# Patient Record
Sex: Male | Born: 1950 | Race: Black or African American | Hispanic: No | Smoking: Never smoker
Health system: Southern US, Community
[De-identification: ages and names within clinical notes are randomized; demographics above are authoritative.]

## PROBLEM LIST (undated history)

## (undated) DIAGNOSIS — I639 Cerebral infarction, unspecified: Secondary | ICD-10-CM

---

## 2018-04-22 ENCOUNTER — Emergency Department (HOSPITAL_COMMUNITY): Payer: Medicare Other

## 2018-04-22 ENCOUNTER — Emergency Department (HOSPITAL_COMMUNITY)
Admission: EM | Admit: 2018-04-22 | Discharge: 2018-04-22 | Disposition: A | Discharge status: Home / Self Care | Payer: Medicare Other | Attending: Emergency Medicine | Admitting: Emergency Medicine

## 2018-04-22 ENCOUNTER — Encounter (HOSPITAL_COMMUNITY): Payer: Self-pay

## 2018-04-22 ENCOUNTER — Other Ambulatory Visit: Payer: Self-pay

## 2018-04-22 DIAGNOSIS — R531 Weakness: Secondary | ICD-10-CM | POA: Insufficient documentation

## 2018-04-22 DIAGNOSIS — Z8673 Personal history of transient ischemic attack (TIA), and cerebral infarction without residual deficits: Secondary | ICD-10-CM | POA: Diagnosis not present

## 2018-04-22 DIAGNOSIS — R200 Anesthesia of skin: Secondary | ICD-10-CM | POA: Diagnosis present

## 2018-04-22 DIAGNOSIS — M5412 Radiculopathy, cervical region: Secondary | ICD-10-CM

## 2018-04-22 HISTORY — DX: Cerebral infarction, unspecified: I63.9

## 2018-04-22 LAB — CBG MONITORING, ED: Glucose-Capillary: 82 mg/dL (ref 70–99)

## 2018-04-22 LAB — BASIC METABOLIC PANEL
Anion gap: 10 (ref 5–15)
BUN: 8 mg/dL (ref 8–23)
CHLORIDE: 106 mmol/L (ref 98–111)
CO2: 22 mmol/L (ref 22–32)
Calcium: 8.9 mg/dL (ref 8.9–10.3)
Creatinine, Ser: 1.09 mg/dL (ref 0.61–1.24)
GFR calc non Af Amer: >60 mL/min (ref >60)
Glucose, Bld: 102 mg/dL — ABNORMAL HIGH (ref 70–99)
Potassium: 3.7 mmol/L (ref 3.5–5.1)
Sodium: 138 mmol/L (ref 135–145)

## 2018-04-22 LAB — APTT: aPTT: 22 seconds — ABNORMAL LOW (ref 24–36)

## 2018-04-22 LAB — CBC
HCT: 38.1 % — ABNORMAL LOW (ref 39.0–52.0)
Hemoglobin: 12.2 g/dL — ABNORMAL LOW (ref 13.0–17.0)
MCH: 27.5 pg (ref 26.0–34.0)
MCHC: 32 g/dL (ref 30.0–36.0)
MCV: 85.8 fL (ref 80.0–100.0)
NRBC: 0.4 % — AB (ref 0.0–0.2)
Platelets: 250 10*3/uL (ref 150–400)
RBC: 4.44 MIL/uL (ref 4.22–5.81)
RDW: 14.3 % (ref 11.5–15.5)
WBC: 4.7 10*3/uL (ref 4.0–10.5)

## 2018-04-22 LAB — I-STAT CREATININE, ED: CREATININE: 1.1 mg/dL (ref 0.61–1.24)

## 2018-04-22 LAB — ETHANOL

## 2018-04-22 LAB — PROTIME-INR
INR: 1 (ref 0.8–1.2)
Prothrombin Time: 12.7 seconds (ref 11.4–15.2)

## 2018-04-22 MED ORDER — IOPAMIDOL (ISOVUE-370) INJECTION 76%
75.0000 mL | Freq: Once | INTRAVENOUS | Status: AC | PRN
  Administered 2018-04-22: 75 mL via INTRAVENOUS

## 2018-04-22 MED ORDER — SODIUM CHLORIDE 0.9% FLUSH: 3.0000 mL | Freq: Once | INTRAVENOUS | Status: DC

## 2018-04-22 NOTE — ED Provider Notes (Signed)
[facility] EMERGENCY DEPARTMENT Provider Note   CSN: [id number] Arrival date & time: 04/22/18  1630  An emergency department physician performed an initial assessment on this suspected stroke patient at 1845.  History   Chief Complaint Chief Complaint  Patient presents with  . Numbness    HPI Derek Palmer is a 68 y.o. male.     HPI Patient states that at roughly 4:00 he had numbness and tingling to his right hand and numbness and tingling to his knee.  Also had some weakness to the hand.  Denies any visual or speech changes.  Denies neck pain.  No trauma.  No chest pain or shortness of breath.   Past Medical History:  Diagnosis Date  . Stroke [facility])     There are no active problems to display for this patient.   History reviewed. No pertinent surgical history.      Home Medications    Prior to Admission medications   Not on File    Family History History reviewed. No pertinent family history.  Social History Social History   Tobacco Use  . Smoking status: Never Smoker  . Smokeless tobacco: Never Used  Substance Use Topics  . Alcohol use: Not on file  . Drug use: Not on file     Allergies   Penicillin g   Review of Systems Review of Systems  Constitutional: Negative for chills and fever.  HENT: Negative for sore throat and trouble swallowing.   Eyes: Negative for visual disturbance.  Respiratory: Negative for cough, chest tightness and shortness of breath.   Cardiovascular: Negative for chest pain and palpitations.  Gastrointestinal: Negative for abdominal pain, diarrhea, nausea and vomiting.  Musculoskeletal: Negative for back pain, myalgias and neck pain.  Skin: Negative for rash and wound.  Neurological: Positive for weakness and numbness. Negative for dizziness, syncope, light-headedness and headaches.  All other systems reviewed and are negative.    Physical Exam Updated Vital Signs BP (!) 144/111 (BP Location:  Right Arm)   Pulse 64   Temp 98.1 F (36.7 C) (Oral)   Resp (!) 21   SpO2 100%   Physical Exam Vitals signs and nursing note reviewed.  Constitutional:      General: He is not in acute distress.    Appearance: Normal appearance. He is well-developed. He is not ill-appearing.  HENT:     Head: Normocephalic and atraumatic.     Comments: Cranial nerves II through XII grossly intact.    Nose: Nose normal.     Mouth/Throat:     Mouth: Mucous membranes are moist.  Eyes:     Extraocular Movements: Extraocular movements intact.     Pupils: Pupils are equal, round, and reactive to light.  Neck:     Musculoskeletal: Normal range of motion and neck supple. No neck rigidity or muscular tenderness.  Cardiovascular:     Rate and Rhythm: Normal rate and regular rhythm.     Heart sounds: No murmur. No friction rub. No gallop.   Pulmonary:     Effort: Pulmonary effort is normal. No respiratory distress.     Breath sounds: Normal breath sounds. No stridor. No wheezing, rhonchi or rales.  Chest:     Chest wall: No tenderness.  Abdominal:     General: Bowel sounds are normal.     Palpations: Abdomen is soft.     Tenderness: There is no abdominal tenderness. There is no guarding or rebound.  Musculoskeletal: Normal range of motion.  General: No swelling, tenderness, deformity or signs of injury.     Right lower leg: No edema.     Left lower leg: No edema.     Comments: Mildly diminished cap refill on right compared to left hand.  Negative Tinel and Phalen sign.  No hand, forearm swelling.  2+ radial pulses bilaterally.  Lymphadenopathy:     Cervical: No cervical adenopathy.  Skin:    General: Skin is warm and dry.     Findings: No erythema or rash.  Neurological:     Mental Status: He is alert and oriented to person, place, and time.     Comments: Diminished sensation to light touch over the distal tips of the fingers of the right hand and to the superior/lateral surface of the right  knee.  Patient appears to have some weakness of the intrinsic muscles of the right hand.  Question mild right-sided grip weakness compared to left.  5/5 motor bilateral lower extremities.  Psychiatric:        Behavior: Behavior normal.      ED Treatments / Results  Labs (all labs ordered are listed, but only abnormal results are displayed) Labs Reviewed  BASIC METABOLIC PANEL - Abnormal; Notable for the following components:      Result Value   Glucose, Bld 102 (*)    All other components within normal limits  CBC - Abnormal; Notable for the following components:   Hemoglobin 12.2 (*)    HCT 38.1 (*)    nRBC 0.4 (*)    All other components within normal limits  APTT - Abnormal; Notable for the following components:   aPTT 22 (*)    All other components within normal limits  ETHANOL  PROTIME-INR  URINALYSIS, ROUTINE W REFLEX MICROSCOPIC  RAPID URINE DRUG SCREEN, HOSP PERFORMED  CBG MONITORING, ED  I-STAT CREATININE, ED    EKG EKG Interpretation  Date/Time:  Monday April 22 2018 16:39:10 EST Ventricular Rate:  72 PR Interval:  186 QRS Duration: 86 QT Interval:  372 QTC Calculation: 407 R Axis:   24 Text Interpretation:  Normal sinus rhythm Normal ECG Confirmed by Loren Racer ([id number]) on 04/22/2018 6:40:58 PM   Radiology Ct Angio Head W Or Wo Contrast  Result Date: 04/22/2018 CLINICAL DATA:  Follow up code stroke.  RIGHT-sided weakness. EXAM: CT ANGIOGRAPHY HEAD AND NECK TECHNIQUE: Multidetector CT imaging of the head and neck was performed using the standard protocol during bolus administration of intravenous contrast. Multiplanar CT image reconstructions and MIPs were obtained to evaluate the vascular anatomy. Carotid stenosis measurements (when applicable) are obtained utilizing NASCET criteria, using the distal internal carotid diameter as the denominator. CONTRAST:  66mL ISOVUE-370 IOPAMIDOL (ISOVUE-370) INJECTION 76% COMPARISON:  CT HEAD April 22, 2018 at 1900 hours.  FINDINGS: CTA NECK FINDINGS: AORTIC ARCH: Normal appearance of the thoracic arch, normal branch pattern. The origins of the innominate, left Common carotid artery and subclavian artery are patent. RIGHT CAROTID SYSTEM: Common carotid artery is patent. Normal appearance of the carotid bifurcation without hemodynamically significant stenosis by NASCET criteria. Normal appearance of the internal carotid artery. LEFT CAROTID SYSTEM: Common carotid artery is patent. Normal appearance of the carotid bifurcation without hemodynamically significant stenosis by NASCET criteria. Normal appearance of the internal carotid artery. VERTEBRAL ARTERIES:Left vertebral artery is dominant. Patent vertebral arteries, mild extrinsic compression due to degenerative cervical spine. SKELETON: No acute osseous process though bone windows have not been submitted. Severe cervical spondylosis. Severe C6-7 neural foraminal narrowing. OTHER NECK:  Soft tissues of the neck are nonacute though, not tailored for evaluation. Mild debris distended esophagus. UPPER CHEST: Included lung apices are clear. Mild centrilobular emphysema. No superior mediastinal lymphadenopathy. CTA HEAD FINDINGS: ANTERIOR CIRCULATION: Patent cervical internal carotid arteries, petrous, cavernous and supra clinoid internal carotid arteries. Patent anterior communicating artery. Patent anterior and middle cerebral arteries. No large vessel occlusion, flow-limiting stenosis, contrast extravasation or aneurysm. POSTERIOR CIRCULATION: Patent vertebral arteries, vertebrobasilar junction and basilar artery, as well as main branch vessels. Patent posterior cerebral arteries. No large vessel occlusion, flow-limiting stenosis, contrast extravasation or aneurysm. VENOUS SINUSES: Major dural venous sinuses are patent though not tailored for evaluation on this angiographic examination. ANATOMIC VARIANTS: None. DELAYED PHASE: Not performed. MIP images reviewed. IMPRESSION: CTA NECK: 1.  Negative CTA NECK. 2. Severe C6-7 neural foraminal narrowing. CTA HEAD: 1. Negative CTA HEAD. Electronically Signed   By: Awilda Metro M.D.   On: 04/22/2018 19:41   Ct Angio Neck W Or Wo Contrast  Result Date: 04/22/2018 CLINICAL DATA:  Follow up code stroke.  RIGHT-sided weakness. EXAM: CT ANGIOGRAPHY HEAD AND NECK TECHNIQUE: Multidetector CT imaging of the head and neck was performed using the standard protocol during bolus administration of intravenous contrast. Multiplanar CT image reconstructions and MIPs were obtained to evaluate the vascular anatomy. Carotid stenosis measurements (when applicable) are obtained utilizing NASCET criteria, using the distal internal carotid diameter as the denominator. CONTRAST:  75mL ISOVUE-370 IOPAMIDOL (ISOVUE-370) INJECTION 76% COMPARISON:  CT HEAD April 22, 2018 at 1900 hours. FINDINGS: CTA NECK FINDINGS: AORTIC ARCH: Normal appearance of the thoracic arch, normal branch pattern. The origins of the innominate, left Common carotid artery and subclavian artery are patent. RIGHT CAROTID SYSTEM: Common carotid artery is patent. Normal appearance of the carotid bifurcation without hemodynamically significant stenosis by NASCET criteria. Normal appearance of the internal carotid artery. LEFT CAROTID SYSTEM: Common carotid artery is patent. Normal appearance of the carotid bifurcation without hemodynamically significant stenosis by NASCET criteria. Normal appearance of the internal carotid artery. VERTEBRAL ARTERIES:Left vertebral artery is dominant. Patent vertebral arteries, mild extrinsic compression due to degenerative cervical spine. SKELETON: No acute osseous process though bone windows have not been submitted. Severe cervical spondylosis. Severe C6-7 neural foraminal narrowing. OTHER NECK: Soft tissues of the neck are nonacute though, not tailored for evaluation. Mild debris distended esophagus. UPPER CHEST: Included lung apices are clear. Mild centrilobular  emphysema. No superior mediastinal lymphadenopathy. CTA HEAD FINDINGS: ANTERIOR CIRCULATION: Patent cervical internal carotid arteries, petrous, cavernous and supra clinoid internal carotid arteries. Patent anterior communicating artery. Patent anterior and middle cerebral arteries. No large vessel occlusion, flow-limiting stenosis, contrast extravasation or aneurysm. POSTERIOR CIRCULATION: Patent vertebral arteries, vertebrobasilar junction and basilar artery, as well as main branch vessels. Patent posterior cerebral arteries. No large vessel occlusion, flow-limiting stenosis, contrast extravasation or aneurysm. VENOUS SINUSES: Major dural venous sinuses are patent though not tailored for evaluation on this angiographic examination. ANATOMIC VARIANTS: None. DELAYED PHASE: Not performed. MIP images reviewed. IMPRESSION: CTA NECK: 1. Negative CTA NECK. 2. Severe C6-7 neural foraminal narrowing. CTA HEAD: 1. Negative CTA HEAD. Electronically Signed   By: Awilda Metro M.D.   On: 04/22/2018 19:41   Mr Brain Wo Contrast  Result Date: 04/22/2018 CLINICAL DATA:  RIGHT hand numbness for 1 hour. EXAM: MRI HEAD WITHOUT CONTRAST TECHNIQUE: Multiplanar, multiecho pulse sequences of the brain and surrounding structures were obtained without intravenous contrast. COMPARISON:  CT HEAD April 22, 2018 at 1900 hours. FINDINGS: Moderately motion degraded coronal T2 sequence. INTRACRANIAL  CONTENTS: No reduced diffusion to suggest acute ischemia. No susceptibility artifact to suggest hemorrhage. No parenchymal brain volume loss for age. No hydrocephalus. No suspicious parenchymal signal, masses, mass effect. No abnormal extra-axial fluid collections. Small calcification along the interhemispheric fissure. VASCULAR: Normal major intracranial vascular flow voids present at skull base. SKULL AND UPPER CERVICAL SPINE: No abnormal sellar expansion. No suspicious calvarial bone marrow signal. Moderate cervical spondylosis. Dental  malocclusion. Craniocervical junction maintained. SINUSES/ORBITS: Trace paranasal sinus mucosal thickening. Mastoid air cells are well aerated.The included ocular globes and orbital contents are non-suspicious. OTHER: None. IMPRESSION: 1. Negative motion degraded MRI of the head without contrast. Electronically Signed   By: Awilda Metro M.D.   On: 04/22/2018 20:59   Mr Cervical Spine Wo Contrast  Result Date: 04/22/2018 CLINICAL DATA:  RIGHT hand numbness beginning today. EXAM: MRI CERVICAL SPINE WITHOUT CONTRAST TECHNIQUE: Multiplanar, multisequence MR imaging of the cervical spine was performed. No intravenous contrast was administered. COMPARISON:  CT angiogram neck April 22, 2018. FINDINGS: ALIGNMENT: Maintained cervical lordosis.  No malalignment. VERTEBRAE/DISCS: Vertebral bodies are intact. Severe C3-4 through C7-T1 disc height loss with proportional chronic discogenic endplate changes, acute component at C3-4. Low T1 and bright STIR signal LEFT C3-4 facet. Borderline congenital canal narrowing. CORD:Cervical spinal cord is normal morphology and signal characteristics from the cervicomedullary junction to level of T2-3, the most caudal well visualized level. POSTERIOR FOSSA, VERTEBRAL ARTERIES, PARASPINAL TISSUES: No MR findings of ligamentous injury. Vertebral artery flow voids present. Included posterior fossa and paraspinal soft tissues are normal. DISC LEVELS: Moderately motion degraded axial sequences limited evaluation. C2-3: Small broad-based disc bulge, uncovertebral hypertrophy. Severe RIGHT and mild LEFT facet arthropathy. Mild canal stenosis. Moderate RIGHT foraminal narrowing. C3-4: Small broad-based disc bulge, uncovertebral hypertrophy and moderate facet arthropathy. Moderate canal stenosis. Moderate to severe bilateral neural foraminal narrowing. C4-5: Small broad-based disc bulge, uncovertebral hypertrophy. Moderate RIGHT facet arthropathy. No canal stenosis. Severe RIGHT and moderate  to severe LEFT neural foraminal narrowing. C5-6: Annular bulging asymmetric to the RIGHT, uncovertebral hypertrophy. No canal stenosis. Severe RIGHT and moderate LEFT neural foraminal narrowing. C6-7: Small broad-based disc bulge, superimposed RIGHT subarticular moderate disc protrusion. Uncovertebral hypertrophy. No canal stenosis. Severe RIGHT > LEFT neural foraminal narrowing. C7-T1: Small broad-based disc bulge, endplate spurring. No canal stenosis. Moderate LEFT neural foraminal narrowing. IMPRESSION: 1. Motion degraded examination.  No fracture or malalignment. 2. 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 3. Moderate canal stenosis C3-4, mild canal stenosis C2-3. 4. Neural foraminal narrowing all cervical levels: Severe at C4-5 through C6-7. Electronically Signed   By: Awilda Metro M.D.   On: 04/22/2018 21:30   Ct Head Code Stroke Wo Contrast  Result Date: 04/22/2018 CLINICAL DATA:  Code stroke. Right-sided hand and leg weakness and numbness beginning 2 hours ago. EXAM: CT HEAD WITHOUT CONTRAST TECHNIQUE: Contiguous axial images were obtained from the base of the skull through the vertex without intravenous contrast. COMPARISON:  None. FINDINGS: Brain: No acute infarct, hemorrhage, or mass lesion is present. The ventricles are of normal size. Basal ganglia are intact. Insular ribbon is normal bilaterally. No acute or focal cortical abnormality is present. Asymmetric calcification along the falx likely represents a meningioma measuring 7 x 11 x 20 mm. No significant mass effect is present. Vascular: No hyperdense vessel or unexpected calcification. Skull: Calvarium is intact. No focal lytic or blastic lesions are present. Sinuses/Orbits: The paranasal sinuses and mastoid air cells are clear. The globes and orbits are  within normal limits. ASPECTS [facility] Stroke Program Early CT Score) - Ganglionic level infarction (caudate, lentiform nuclei,  internal capsule, insula, M1-M3 cortex): 7/7 - Supraganglionic infarction (M4-M6 cortex): 3/3 Total score (0-10 with 10 being normal): 10/10 IMPRESSION: 1. Normal CT appearance of the brain. No acute or focal abnormality to explain acute onset of right-sided weakness or numbness. 2. Calcification along the falx is asymmetric, likely a meningioma. Asymmetric ossification of the could have this appearance. 3. ASPECTS is 10/10 The above was relayed via text pager to Dr. Laurence Slate on 04/22/2018 at 19:03 . Electronically Signed   By: Marin Roberts M.D.   On: 04/22/2018 19:05    Procedures Procedures (including critical care time)  Medications Ordered in ED Medications  sodium chloride flush (NS) 0.9 % injection 3 mL (3 mLs Intravenous Not Given 04/22/18 1825)  iopamidol (ISOVUE-370) 76 % injection 75 mL (75 mLs Intravenous Contrast Given 04/22/18 1912)     Initial Impression / Assessment and Plan / ED Course  I have reviewed the triage vital signs and the nursing notes.  Pertinent labs & imaging results that were available during my care of the patient were reviewed by me and considered in my medical decision making (see chart for details).        Given numbness to the right upper extremity and and questionable knee symptoms code stroke was called after initial evaluation.  Neurology at bedside.    MRI brain and cervical spine performed.  No evidence of stroke.  Patient does have significant right greater than left neuroforaminal narrowing.  Neurology believes this is the cause of his symptoms.  Have advised outpatient follow-up with neurosurgery.  Return precautions given. Final Clinical Impressions(s) / ED Diagnoses   Final diagnoses:  Cervical radiculopathy    ED Discharge Orders    None       Loren Racer, MD 04/22/18 2206

## 2018-04-22 NOTE — ED Notes (Signed)
Patient verbalizes understanding of discharge instructions. Opportunity for questioning and answers were provided. Armband removed by staff, pt discharged from ED.  

## 2018-04-22 NOTE — Consult Note (Signed)
Requesting Physician: Dr. Ranae Palms    Chief Complaint:  Right arm numbness, weakness  History obtained from: Patient and Chart     HPI:                                                                                                                                       Derek Palmer is an 68 y.o. male with past medical history of stroke 40 years ago who presents to the emergency room with sudden onset numbness, weakness of his right arm and numbness around his right knee.  The patient works as a Investment banker, operational at [employer] and while at work around 5 PM noticed versus right hand thumb numb and tingling that gradually moved upwards to involve his entire right arm.  He also noticed around the same time that around his right knee was also known.  He did feel his right arm was slightly weaker and decided to come to the emergency room where he was stroke alerted.  NIH stroke scale on initial assessment was 0.  Stat CT head was unremarkable.  CT angiogram of the head and neck was also negative for stenosis.  CT of the neck showed severe C6-C7 foraminal narrowing as well as cervical spondylosis.  TPA was not administered as symptoms were too mild and nondisabling.  Date last known well: 3.2.20 Time last known well: 5 pm tPA Given: no, non disabling symptoms NIHSS: 0    Past Medical History:  Diagnosis Date  . Stroke [facility])     History reviewed. No pertinent surgical history.  History reviewed. No pertinent family history. Social History:  reports that he has never smoked. He has never used smokeless tobacco. No history on file for alcohol and drug.  Allergies:  Allergies  Allergen Reactions  . Penicillin G Rash    Other reaction(s): Respiratory Distress (ALLERGY/intolerance)    Medications:                                                                                                                        I reviewed home medications   ROS:  14 systems reviewed and negative except above    Examination:                                                                                                      General: Appears well-developed  Psych: Affect appropriate to situation Eyes: No scleral injection HENT: No OP obstrucion Head: Normocephalic.  Cardiovascular: Normal rate and regular rhythm.  Respiratory: Effort normal and breath sounds normal to anterior ascultation GI: Soft.  No distension. There is no tenderness.  Skin: WDI    Neurological Examination Mental Status: Alert, oriented, thought content appropriate.  Speech fluent without evidence of aphasia. Able to follow 3 step commands without difficulty. Cranial Nerves: II: Visual fields grossly normal,  III,IV, VI: ptosis not present, extra-ocular motions intact bilaterally, pupils equal, round, reactive to light and accommodation V,VII: smile symmetric, facial light touch sensation normal bilaterally VIII: hearing normal bilaterally IX,X: uvula rises symmetrically XI: bilateral shoulder shrug XII: midline tongue extension Motor: Right : Upper extremity   5/5    Left:     Upper extremity   5/5  Lower extremity   5/5     Lower extremity   5/5 Tone and bulk:normal tone throughout; no atrophy noted Sensory: Pinprick and light touch intact throughout, bilaterally ( no objective difference in sensation between either side)  Plantars: Right: downgoing   Left: downgoing Cerebellar: normal finger-to-nose, normal rapid alternating movements and normal heel-to-shin test Gait: normal gait and station     Lab Results: Basic Metabolic Panel: Recent Labs  Lab 04/22/18 1702 04/22/18 1858  NA 138  --   K 3.7  --   CL 106  --   CO2 22  --   GLUCOSE 102*  --   BUN 8  --   CREATININE 1.09 1.10  CALCIUM 8.9  --     CBC: Recent Labs  Lab 04/22/18 1702  WBC 4.7  HGB  12.2*  HCT 38.1*  MCV 85.8  PLT 250    Coagulation Studies: No results for input(s): LABPROT, INR in the last 72 hours.  Imaging: Ct Angio Head W Or Wo Contrast  Result Date: 04/22/2018 CLINICAL DATA:  Follow up code stroke.  RIGHT-sided weakness. EXAM: CT ANGIOGRAPHY HEAD AND NECK TECHNIQUE: Multidetector CT imaging of the head and neck was performed using the standard protocol during bolus administration of intravenous contrast. Multiplanar CT image reconstructions and MIPs were obtained to evaluate the vascular anatomy. Carotid stenosis measurements (when applicable) are obtained utilizing NASCET criteria, using the distal internal carotid diameter as the denominator. CONTRAST:  71mL ISOVUE-370 IOPAMIDOL (ISOVUE-370) INJECTION 76% COMPARISON:  CT HEAD April 22, 2018 at 1900 hours. FINDINGS: CTA NECK FINDINGS: AORTIC ARCH: Normal appearance of the thoracic arch, normal branch pattern. The origins of the innominate, left Common carotid artery and subclavian artery are patent. RIGHT CAROTID SYSTEM: Common carotid artery is patent. Normal appearance of the carotid bifurcation without hemodynamically significant stenosis by NASCET criteria. Normal appearance of the internal carotid artery. LEFT CAROTID SYSTEM: Common carotid artery is patent. Normal appearance of the carotid bifurcation without  hemodynamically significant stenosis by NASCET criteria. Normal appearance of the internal carotid artery. VERTEBRAL ARTERIES:Left vertebral artery is dominant. Patent vertebral arteries, mild extrinsic compression due to degenerative cervical spine. SKELETON: No acute osseous process though bone windows have not been submitted. Severe cervical spondylosis. Severe C6-7 neural foraminal narrowing. OTHER NECK: Soft tissues of the neck are nonacute though, not tailored for evaluation. Mild debris distended esophagus. UPPER CHEST: Included lung apices are clear. Mild centrilobular emphysema. No superior mediastinal  lymphadenopathy. CTA HEAD FINDINGS: ANTERIOR CIRCULATION: Patent cervical internal carotid arteries, petrous, cavernous and supra clinoid internal carotid arteries. Patent anterior communicating artery. Patent anterior and middle cerebral arteries. No large vessel occlusion, flow-limiting stenosis, contrast extravasation or aneurysm. POSTERIOR CIRCULATION: Patent vertebral arteries, vertebrobasilar junction and basilar artery, as well as main branch vessels. Patent posterior cerebral arteries. No large vessel occlusion, flow-limiting stenosis, contrast extravasation or aneurysm. VENOUS SINUSES: Major dural venous sinuses are patent though not tailored for evaluation on this angiographic examination. ANATOMIC VARIANTS: None. DELAYED PHASE: Not performed. MIP images reviewed. IMPRESSION: CTA NECK: 1. Negative CTA NECK. 2. Severe C6-7 neural foraminal narrowing. CTA HEAD: 1. Negative CTA HEAD. Electronically Signed   By: Awilda Metro M.D.   On: 04/22/2018 19:41   Ct Angio Neck W Or Wo Contrast  Result Date: 04/22/2018 CLINICAL DATA:  Follow up code stroke.  RIGHT-sided weakness. EXAM: CT ANGIOGRAPHY HEAD AND NECK TECHNIQUE: Multidetector CT imaging of the head and neck was performed using the standard protocol during bolus administration of intravenous contrast. Multiplanar CT image reconstructions and MIPs were obtained to evaluate the vascular anatomy. Carotid stenosis measurements (when applicable) are obtained utilizing NASCET criteria, using the distal internal carotid diameter as the denominator. CONTRAST:  75mL ISOVUE-370 IOPAMIDOL (ISOVUE-370) INJECTION 76% COMPARISON:  CT HEAD April 22, 2018 at 1900 hours. FINDINGS: CTA NECK FINDINGS: AORTIC ARCH: Normal appearance of the thoracic arch, normal branch pattern. The origins of the innominate, left Common carotid artery and subclavian artery are patent. RIGHT CAROTID SYSTEM: Common carotid artery is patent. Normal appearance of the carotid bifurcation  without hemodynamically significant stenosis by NASCET criteria. Normal appearance of the internal carotid artery. LEFT CAROTID SYSTEM: Common carotid artery is patent. Normal appearance of the carotid bifurcation without hemodynamically significant stenosis by NASCET criteria. Normal appearance of the internal carotid artery. VERTEBRAL ARTERIES:Left vertebral artery is dominant. Patent vertebral arteries, mild extrinsic compression due to degenerative cervical spine. SKELETON: No acute osseous process though bone windows have not been submitted. Severe cervical spondylosis. Severe C6-7 neural foraminal narrowing. OTHER NECK: Soft tissues of the neck are nonacute though, not tailored for evaluation. Mild debris distended esophagus. UPPER CHEST: Included lung apices are clear. Mild centrilobular emphysema. No superior mediastinal lymphadenopathy. CTA HEAD FINDINGS: ANTERIOR CIRCULATION: Patent cervical internal carotid arteries, petrous, cavernous and supra clinoid internal carotid arteries. Patent anterior communicating artery. Patent anterior and middle cerebral arteries. No large vessel occlusion, flow-limiting stenosis, contrast extravasation or aneurysm. POSTERIOR CIRCULATION: Patent vertebral arteries, vertebrobasilar junction and basilar artery, as well as main branch vessels. Patent posterior cerebral arteries. No large vessel occlusion, flow-limiting stenosis, contrast extravasation or aneurysm. VENOUS SINUSES: Major dural venous sinuses are patent though not tailored for evaluation on this angiographic examination. ANATOMIC VARIANTS: None. DELAYED PHASE: Not performed. MIP images reviewed. IMPRESSION: CTA NECK: 1. Negative CTA NECK. 2. Severe C6-7 neural foraminal narrowing. CTA HEAD: 1. Negative CTA HEAD. Electronically Signed   By: Awilda Metro M.D.   On: 04/22/2018 19:41   Ct Head Code Stroke  Wo Contrast  Result Date: 04/22/2018 CLINICAL DATA:  Code stroke. Right-sided hand and leg weakness and  numbness beginning 2 hours ago. EXAM: CT HEAD WITHOUT CONTRAST TECHNIQUE: Contiguous axial images were obtained from the base of the skull through the vertex without intravenous contrast. COMPARISON:  None. FINDINGS: Brain: No acute infarct, hemorrhage, or mass lesion is present. The ventricles are of normal size. Basal ganglia are intact. Insular ribbon is normal bilaterally. No acute or focal cortical abnormality is present. Asymmetric calcification along the falx likely represents a meningioma measuring 7 x 11 x 20 mm. No significant mass effect is present. Vascular: No hyperdense vessel or unexpected calcification. Skull: Calvarium is intact. No focal lytic or blastic lesions are present. Sinuses/Orbits: The paranasal sinuses and mastoid air cells are clear. The globes and orbits are within normal limits. ASPECTS [facility] Stroke Program Early CT Score) - Ganglionic level infarction (caudate, lentiform nuclei, internal capsule, insula, M1-M3 cortex): 7/7 - Supraganglionic infarction (M4-M6 cortex): 3/3 Total score (0-10 with 10 being normal): 10/10 IMPRESSION: 1. Normal CT appearance of the brain. No acute or focal abnormality to explain acute onset of right-sided weakness or numbness. 2. Calcification along the falx is asymmetric, likely a meningioma. Asymmetric ossification of the could have this appearance. 3. ASPECTS is 10/10 The above was relayed via text pager to Dr. Laurence Slate on 04/22/2018 at 19:03 . Electronically Signed   By: Marin Roberts M.D.   On: 04/22/2018 19:05     ASSESSMENT AND PLAN   68 y.o. male with past medical history of stroke 40 years ago who presents to the emergency room with sudden onset numbness, weakness of his right arm and numbness around his right knee.  Impression:   R UE and RL numbness  Recommendations  MR Brain and C spine   Addendum MRI C-spine shows severe right greater than left foraminal stenosis from C4-C5 to C6-C7.  Negative for acute stroke.  Was  discharged from the emergency department after results were relayed to my colleague Dr. Amada Jupiter- recommended to follow-up with neurosurgery.  I reviewed the MRI brain and C-spine and am in agreement with the plan.   If 7pm to 7am, please call on call as listed on AMION.   Jernard Reiber [facility] Pager Number [phone]

## 2018-04-22 NOTE — ED Triage Notes (Signed)
Pt. Stated, my rt. Hand feels numb. VAN- neg. Started 40 minutes ago.

## 2018-04-22 NOTE — ED Notes (Signed)
Patient transported to MR. 

## 2018-04-22 NOTE — Code Documentation (Signed)
68 year old male presents to [facility] with c/o numbness in his right hand that started an hour before admission to ED.  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  EDP exam also noted numbess in his right leg and code stroke was called.  Initial NIHSS 0 - has tingling in the right hand moving to the arm and in the right knee area but simultaneous sensation is equal. CT head and CTA head and neck completed.  Dr. Laurence Slate at bedside - no acute treatment.  Handoff to [employer].

## 2019-07-09 IMAGING — MR MR HEAD W/O CM
12 of 13 series · 44 of 48 positions shown · non-contrast
Comparison: CT HEAD April 22, 2018 at 1177 hours.

CLINICAL DATA: RIGHT hand numbness for 1 hour.

EXAM:
MRI HEAD WITHOUT CONTRAST
TECHNIQUE: Multiplanar, multiecho pulse sequences of the brain and surrounding
structures were obtained without intravenous contrast.

[Series 5: DWI · axial · 3.0mm · 0.88mm/px · z∈[-62,+85]mm · 7 of 100 slices shown (1 of 4)]
[im 1/100]
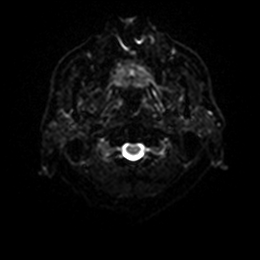
[im 17/100]
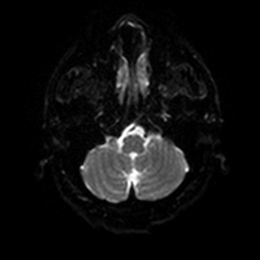
[im 34/100]
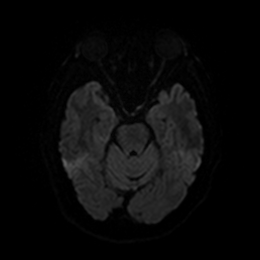
[im 50/100]
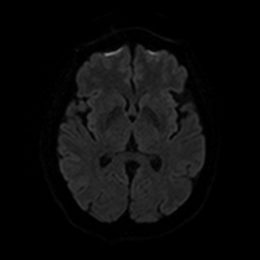
[im 67/100]
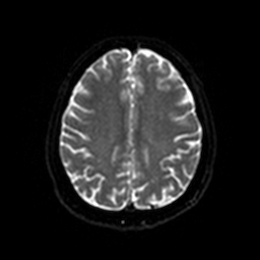
[im 83/100]
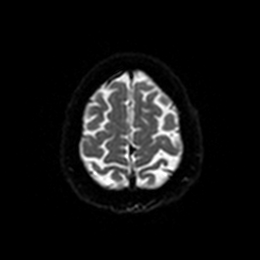
[im 100/100]
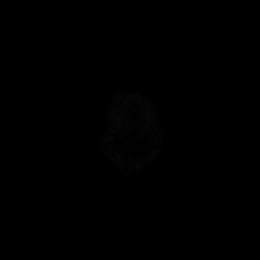

[Series 6: DWI · axial · 3.0mm · 0.88mm/px · z∈[-62,+85]mm · 4 of 50 slices shown (2 of 4)]
[im 1/50]
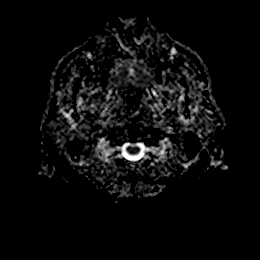
[im 17/50]
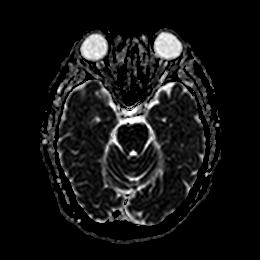
[im 33/50]
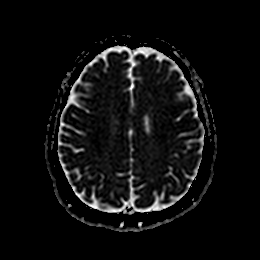
[im 50/50]
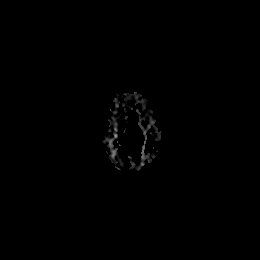

[Series 7: DWI · coronal · 4.0mm · 0.88mm/px · 5 of 64 slices shown (3 of 4)]
[im 1/64]
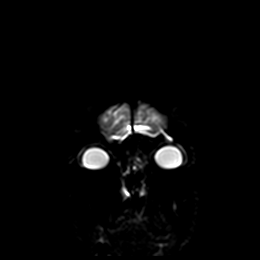
[im 16/64]
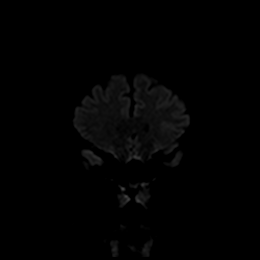
[im 32/64]
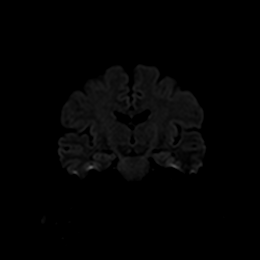
[im 48/64]
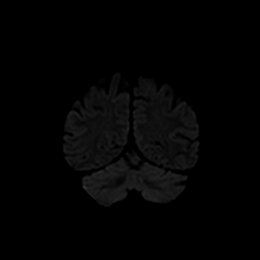
[im 64/64]
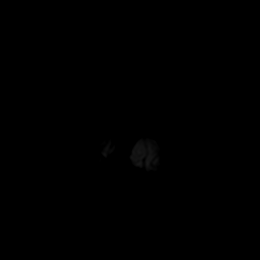

[Series 8: DWI · coronal · 4.0mm · 0.88mm/px · 2 of 32 slices shown (4 of 4)]
[im 1/32]
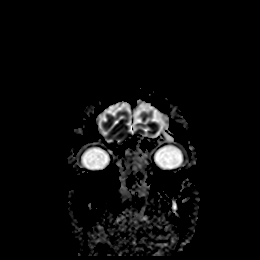
[im 32/32]
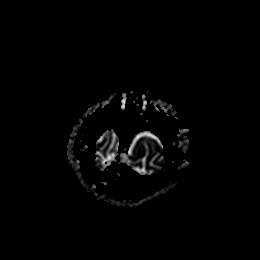

[Series 9: T1 · sagittal · 5.0mm · 0.75mm/px · 2 of 23 slices shown]
[im 1/23]
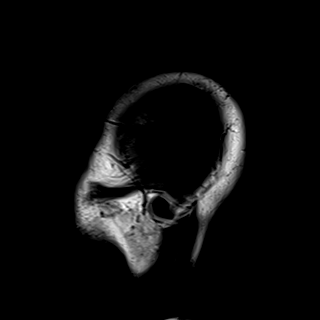
[im 23/23]
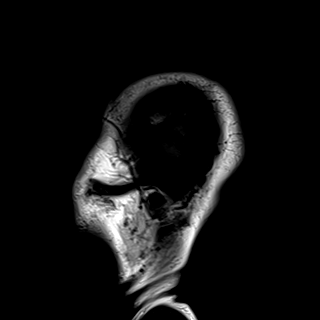

[Series 10: T2 · axial · 5.0mm · 0.72mm/px · z∈[-63,+93]mm · 2 of 27 slices shown (1 of 2)]
[im 1/27]
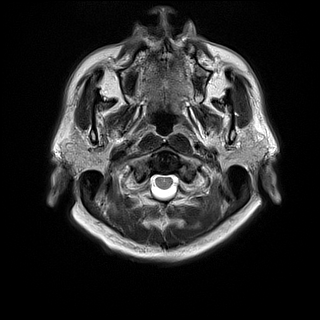
[im 27/27]
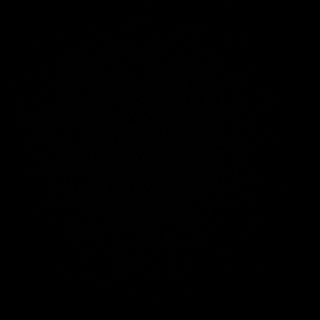

[Series 11: FLAIR · axial · 5.0mm · 0.45mm/px · z∈[-63,+93]mm · 2 of 27 slices shown]
[im 1/27]
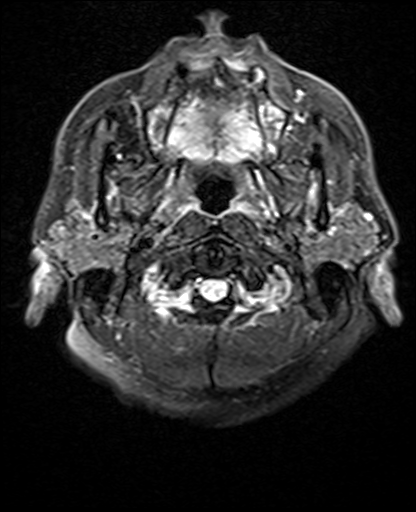
[im 27/27]
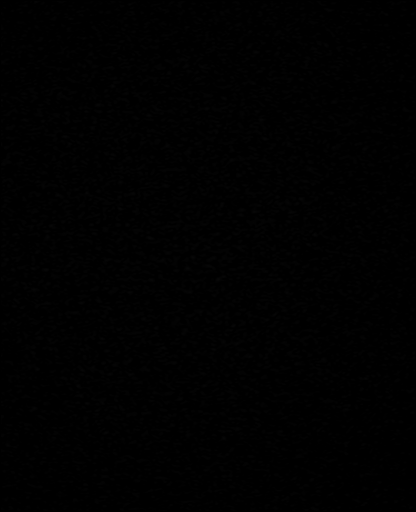

[Series 12: mag_images · axial · 3.0mm · 0.90mm/px · z∈[-73,+103]mm · 5 of 60 slices shown]
[im 1/60]
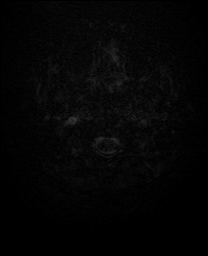
[im 15/60]
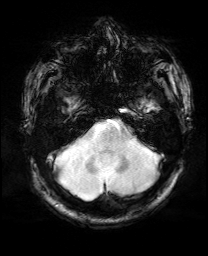
[im 30/60]
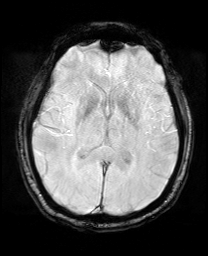
[im 45/60]
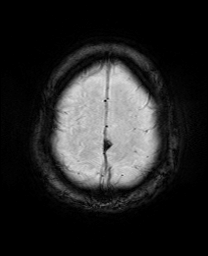
[im 60/60]
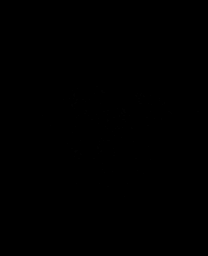

[Series 13: pha_images · axial · 3.0mm · 0.90mm/px · z∈[-73,+100]mm · 4 of 56 slices shown]
[im 1/56]
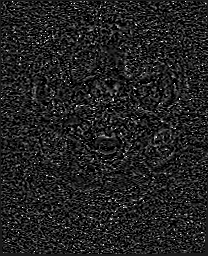
[im 19/56]
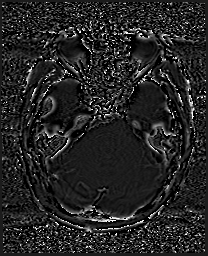
[im 37/56]
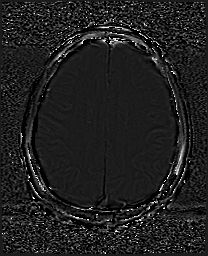
[im 56/56]
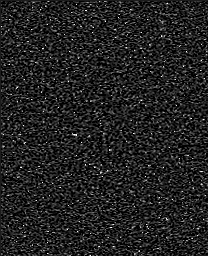

[Series 14: swi_images · axial · 3.0mm · 0.90mm/px · z∈[-73,+103]mm · 5 of 60 slices shown]
[im 1/60]
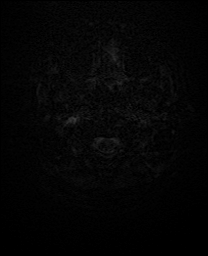
[im 15/60]
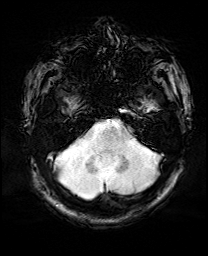
[im 30/60]
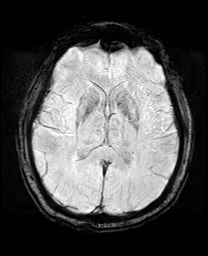
[im 45/60]
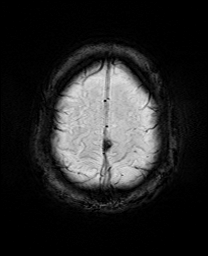
[im 60/60]
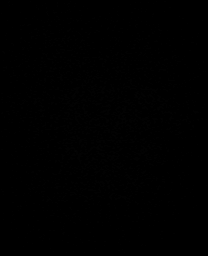

[Series 15: mip_images(sw) · axial · 24.0mm · 0.90mm/px · z∈[-63,+93]mm · 4 of 53 slices shown]
[im 1/53]
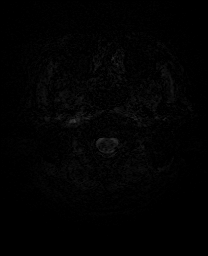
[im 18/53]
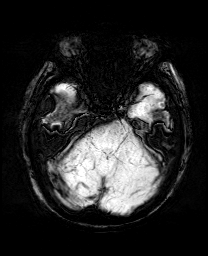
[im 35/53]
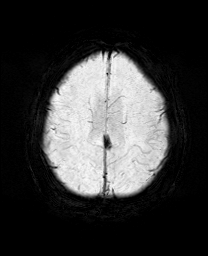
[im 53/53]
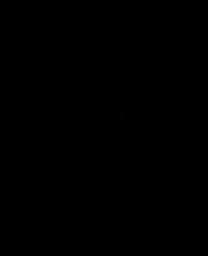

[Series 17: T2 · coronal · 5.0mm · 0.34mm/px · 2 of 29 slices shown (2 of 2)]
[im 1/29]
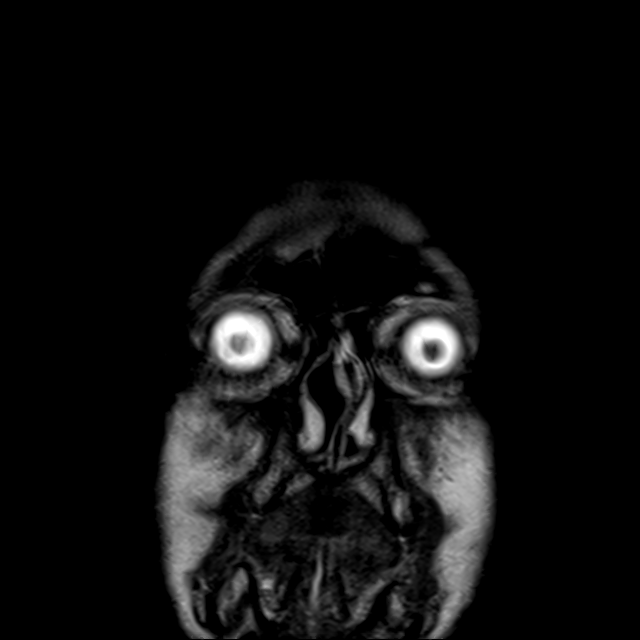
[im 29/29]
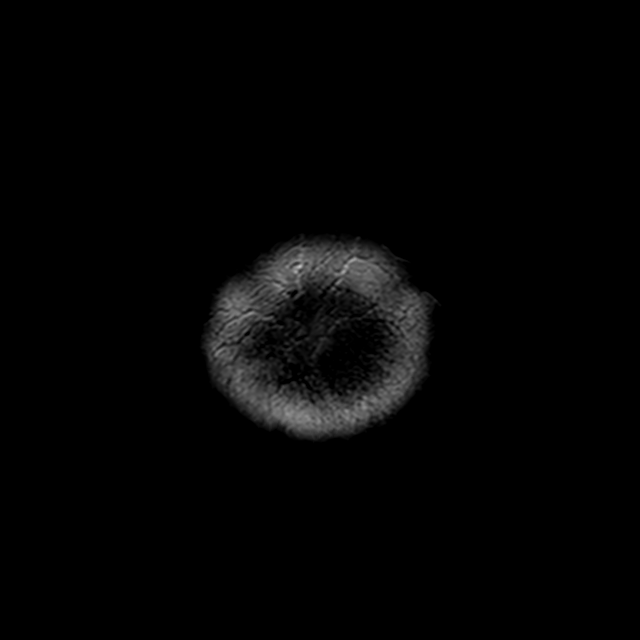

[44 of 48 positions shown; findings below may reference images not displayed]

FINDINGS: Moderately motion degraded coronal T2 sequence.

INTRACRANIAL CONTENTS: No reduced diffusion to suggest acute
ischemia. No susceptibility artifact to suggest hemorrhage. No
parenchymal brain volume loss for age. No hydrocephalus. No
suspicious parenchymal signal, masses, mass effect. No abnormal
extra-axial fluid collections. Small calcification along the
interhemispheric fissure.

VASCULAR: Normal major intracranial vascular flow voids present at
skull base.

SKULL AND UPPER CERVICAL SPINE: No abnormal sellar expansion. No
suspicious calvarial bone marrow signal. Moderate cervical
spondylosis. Dental malocclusion. Craniocervical junction
maintained.

SINUSES/ORBITS: Trace paranasal sinus mucosal thickening. Mastoid
air cells are well aerated.The included ocular globes and orbital
contents are non-suspicious.

OTHER: None.
IMPRESSION: 1. Negative motion degraded MRI of the head without contrast.

## 2019-07-09 IMAGING — CT CT ANGIO NECK
2 of 7 series · 8 of 33 positions shown · IV contrast (OMNI 350)
Comparison: CT HEAD April 22, 2018 at 5266 hours.

CLINICAL DATA: Follow up code stroke.  RIGHT-sided weakness.

EXAM:
CT ANGIOGRAPHY HEAD AND NECK
TECHNIQUE: Multidetector CT imaging of the head and neck was performed using
the standard protocol during bolus administration of intravenous
contrast. Multiplanar CT image reconstructions and MIPs were
obtained to evaluate the vascular anatomy. Carotid stenosis
measurements (when applicable) are obtained utilizing NASCET
criteria, using the distal internal carotid diameter as the
denominator.
CONTRAST:  75mL FLKD8Z-C4R IOPAMIDOL (FLKD8Z-C4R) INJECTION 76%

[Series 5: cta neck · axial · 0.45mm/px · z∈[-200,-92]mm · 2 of 163 slices shown]
[im 55/163  soft-tissue]
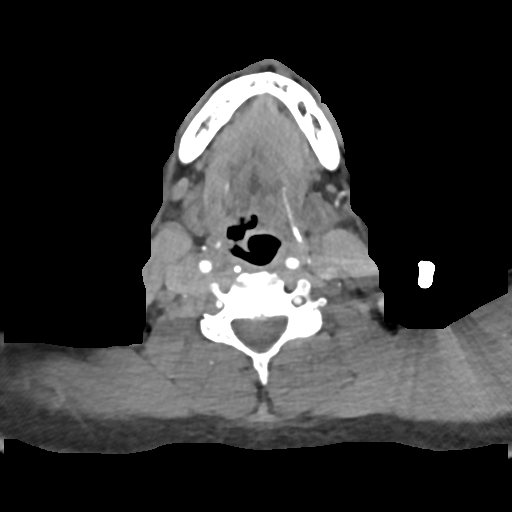
[im 109/163  soft-tissue]
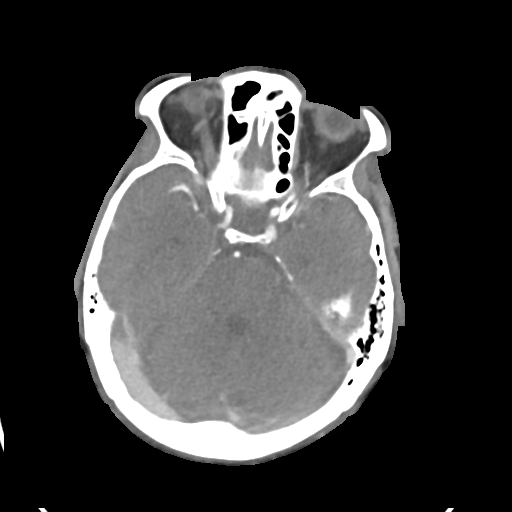

[Series 7: cta neck axial · axial · 0.39mm/px · z∈[-262,-29]mm · 6 of 327 slices shown]
[im 47/327  soft-tissue]
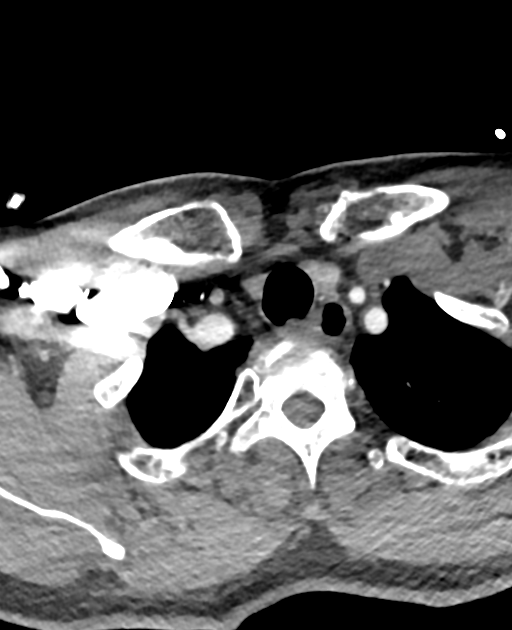
[im 94/327  bone]
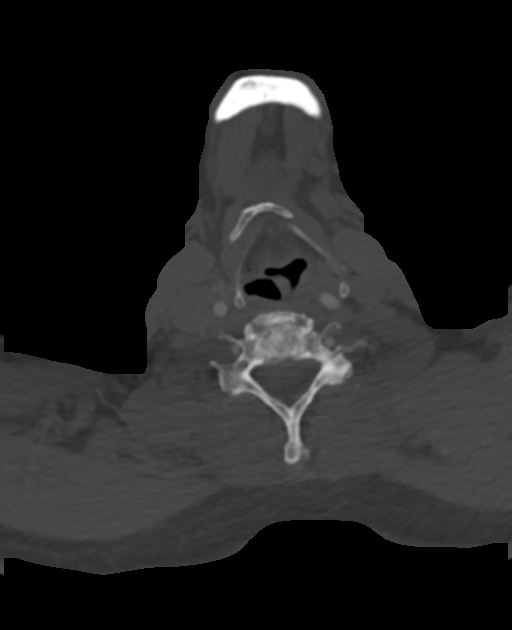
[im 140/327  soft-tissue]
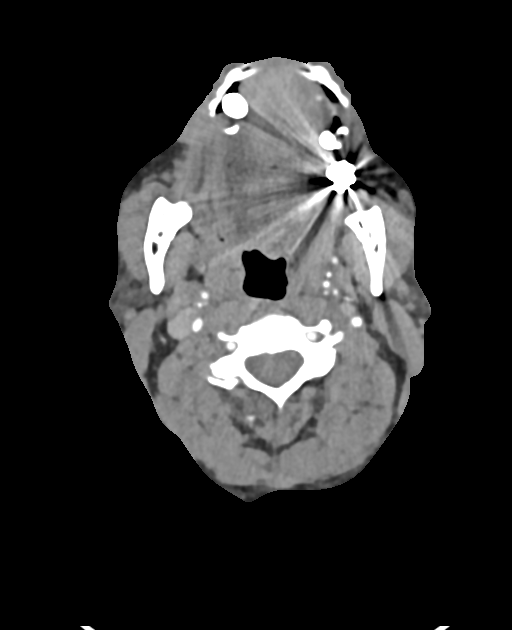
[im 187/327  bone]
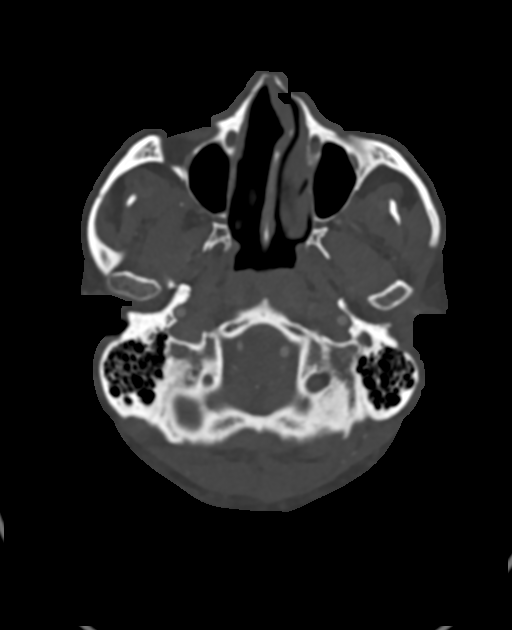
[im 233/327  soft-tissue]
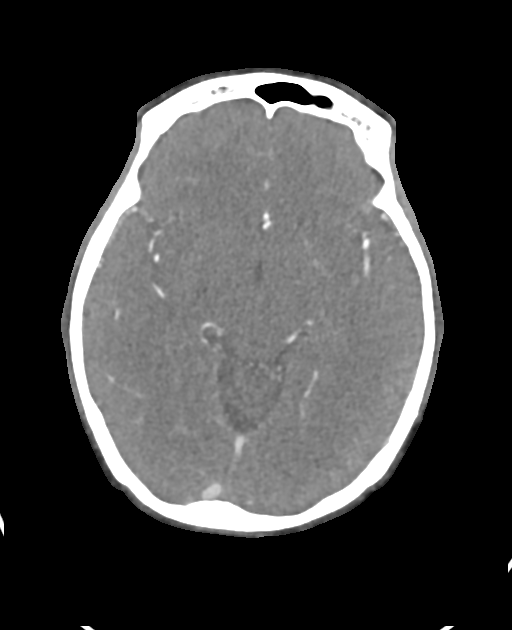
[im 280/327  bone]
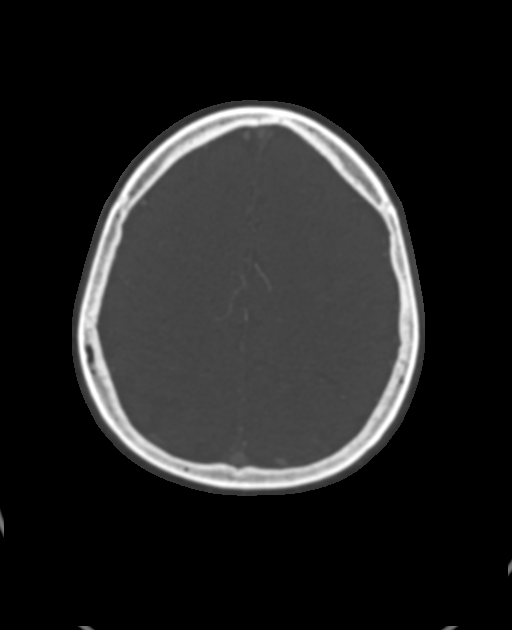

[8 of 33 positions shown; findings below may reference images not displayed]

FINDINGS: CTA NECK FINDINGS:

AORTIC ARCH: Normal appearance of the thoracic arch, normal branch
pattern. The origins of the innominate, left Common carotid artery
and subclavian artery are patent.

RIGHT CAROTID SYSTEM: Common carotid artery is patent. Normal
appearance of the carotid bifurcation without hemodynamically
significant stenosis by NASCET criteria. Normal appearance of the
internal carotid artery.

LEFT CAROTID SYSTEM: Common carotid artery is patent. Normal
appearance of the carotid bifurcation without hemodynamically
significant stenosis by NASCET criteria. Normal appearance of the
internal carotid artery.

VERTEBRAL ARTERIES:Left vertebral artery is dominant. Patent
vertebral arteries, mild extrinsic compression due to degenerative
cervical spine.

SKELETON: No acute osseous process though bone windows have not been
submitted. Severe cervical spondylosis. Severe C6-7 neural foraminal
narrowing.

OTHER NECK: Soft tissues of the neck are nonacute though, not
tailored for evaluation. Mild debris distended esophagus.

UPPER CHEST: Included lung apices are clear. Mild centrilobular
emphysema. No superior mediastinal lymphadenopathy.

CTA HEAD FINDINGS:

ANTERIOR CIRCULATION: Patent cervical internal carotid arteries,
petrous, cavernous and supra clinoid internal carotid arteries.
Patent anterior communicating artery. Patent anterior and middle
cerebral arteries.

No large vessel occlusion, flow-limiting stenosis, contrast
extravasation or aneurysm.

POSTERIOR CIRCULATION: Patent vertebral arteries, vertebrobasilar
junction and basilar artery, as well as main branch vessels. Patent
posterior cerebral arteries.

No large vessel occlusion, flow-limiting stenosis, contrast
extravasation or aneurysm.

VENOUS SINUSES: Major dural venous sinuses are patent though not
tailored for evaluation on this angiographic examination.

ANATOMIC VARIANTS: None.

DELAYED PHASE: Not performed.

MIP images reviewed.
IMPRESSION: CTA NECK:

1. Negative CTA NECK.
2. Severe C6-7 neural foraminal narrowing.

CTA HEAD:

1. Negative CTA HEAD.
